# Patient Record
Sex: Female | Born: 1965 | Race: Black or African American | Hispanic: No | Smoking: Never smoker
Health system: Southern US, Community
[De-identification: ages and names within clinical notes are randomized; demographics above are authoritative.]

## PROBLEM LIST (undated history)

## (undated) DIAGNOSIS — M542 Cervicalgia: Secondary | ICD-10-CM

## (undated) HISTORY — PX: ABDOMINAL HYSTERECTOMY: SHX81

---

## 2001-10-17 ENCOUNTER — Emergency Department (HOSPITAL_COMMUNITY): Admission: EM | Admit: 2001-10-17 | Discharge: 2001-10-17 | Payer: Self-pay | Admitting: *Deleted

## 2001-10-17 ENCOUNTER — Encounter: Payer: Self-pay | Admitting: *Deleted

## 2003-03-30 ENCOUNTER — Inpatient Hospital Stay (HOSPITAL_COMMUNITY): Admission: AD | Admit: 2003-03-30 | Discharge: 2003-04-02 | Payer: Self-pay | Admitting: Obstetrics & Gynecology

## 2005-12-13 ENCOUNTER — Inpatient Hospital Stay (HOSPITAL_COMMUNITY): Admission: AD | Admit: 2005-12-13 | Discharge: 2005-12-13 | Payer: Self-pay | Admitting: Gynecology

## 2006-05-28 ENCOUNTER — Other Ambulatory Visit: Admission: RE | Admit: 2006-05-28 | Discharge: 2006-05-28 | Payer: Self-pay | Admitting: Obstetrics and Gynecology

## 2006-09-23 ENCOUNTER — Emergency Department (HOSPITAL_COMMUNITY): Admission: EM | Admit: 2006-09-23 | Discharge: 2006-09-23 | Payer: Self-pay | Admitting: Emergency Medicine

## 2006-09-25 ENCOUNTER — Ambulatory Visit: Payer: Self-pay | Admitting: Family Medicine

## 2006-10-16 ENCOUNTER — Emergency Department (HOSPITAL_COMMUNITY): Admission: EM | Admit: 2006-10-16 | Discharge: 2006-10-16 | Payer: Self-pay | Admitting: Emergency Medicine

## 2006-10-17 ENCOUNTER — Emergency Department (HOSPITAL_COMMUNITY): Admission: EM | Admit: 2006-10-17 | Discharge: 2006-10-18 | Payer: Self-pay | Admitting: Emergency Medicine

## 2007-04-19 ENCOUNTER — Inpatient Hospital Stay (HOSPITAL_COMMUNITY): Admission: AD | Admit: 2007-04-19 | Discharge: 2007-04-19 | Payer: Self-pay | Admitting: Family Medicine

## 2007-04-20 ENCOUNTER — Emergency Department (HOSPITAL_COMMUNITY): Admission: EM | Admit: 2007-04-20 | Discharge: 2007-04-20 | Payer: Self-pay | Admitting: Emergency Medicine

## 2009-12-02 ENCOUNTER — Emergency Department (HOSPITAL_COMMUNITY): Admission: EM | Admit: 2009-12-02 | Discharge: 2009-12-02 | Payer: Self-pay | Admitting: Emergency Medicine

## 2011-01-18 NOTE — Op Note (Signed)
Brianna May, Brianna May                      ACCOUNT NO.:  [id number]   MEDICAL RECORD NO.:  [id number]                   PATIENT TYPE:  INP   LOCATION:  A426                                 FACILITY:  [facility]   PHYSICIAN:  Lazaro Arms, M.D.                DATE OF BIRTH:  1966-07-23   DATE OF PROCEDURE:  03/30/2003  DATE OF DISCHARGE:                                 OPERATIVE REPORT   PREOPERATIVE DIAGNOSES:  1. Enlarged fibroid uterus.  2. Menometrorrhagia.  3. Status post transfusion x14 units of packed red blood cells total.  4. Dysmenorrhea.   POSTOPERATIVE DIAGNOSES:  1. Enlarged fibroid uterus.  2. Menometrorrhagia.  3. Status post transfusion x14 units of packed red blood cells total.  4. Dysmenorrhea.   PROCEDURE:  Abdominal hysterectomy.   SURGEON:  Lazaro Arms, M.D.   ANESTHESIA:  General endotracheal.   FINDINGS:  The patient had a mobile, enlarged uterus globally, approximately  12-week size, and it was very tender to palpation.  The adnexa were  negative.  This was confirmed at the time of surgery.  There were no other  intraperitoneal abnormalities.   DESCRIPTION OF OPERATION:  The patient was taken to the operating room and  placed in the supine position, where she underwent general endotracheal  anesthesia.  The vagina was prepped, the Foley catheter was placed.  The  abdomen was prepped and draped in the usual sterile fashion.  A Pfannenstiel  skin incision was made and carried down sharply to the rectus fascia, which  was scored in the midline and extended laterally.  The fascia was taken off  the muscle superiorly and inferiorly without difficulty.  The muscles were  divided, peritoneal cavity was entered.  The small plastic self-retaining  retractor was placed and folded down without difficulty.  The upper abdomen  was then packed away.  The uterine cornua were grasped.  The left round  ligament was suture ligated and cut.  The vesicouterine  serosal flap was  created on the left.  The utero-ovarian ligament was then crossclamped, cut,  and double suture ligated.  The uterine vessels were skeletonized on the  left.  The right round ligament was suture ligated and cut and the  vesicouterine serosal flap on the right was created.  The utero-ovarian  ligament on the right was clamped, cut, and double suture ligated with good  hemostasis.  The right uterine vessels were skeletonized.  The bladder was  pushed off the lower uterine segment without difficulty.  Both uterine  vessels were clamped, cut, and suture ligated.  Serial pedicles were taken  down the cervix through the cardinal ligament, each pedicle being clamped,  cut, and transfixed and suture ligated.  The vagina was crossclamped and the  specimen was removed.  Vaginal angle sutures were placed.  Interrupted  figure-of-eight sutures were placed for vaginal closure.  There was good  hemostasis.  The peritoneal cavity was irrigated vigorously.  All pedicles  were found to be hemostatic.  The packs were removed and the self-retaining  retractor was unfolded and removed.  All counts were correct at this point.  The muscle and peritoneum were reapproximated loosely.  The fascia was  closed using 0 Vicryl in a running fashion.  The subcutaneous tissue was  made hemostatic and irrigated.  The skin was closed using skin staples.  The  patient tolerated the procedure well.  She experienced 100 mL of blood loss,  was taken to the recovery room in good, stable condition.  All counts were  correct x3.  She received Ancef prophylactically.  All specimens went to the  lab.                                               Lazaro Arms, M.D.    Loraine Maple  D:  03/31/2003  T:  04/01/2003  Job:  [id number]

## 2011-01-18 NOTE — H&P (Signed)
[name], [name]                        ACCOUNT NO.:  [id number]   MEDICAL RECORD NO.:  [id number]                  PATIENT TYPE:   LOCATION:                                       FACILITY:   PHYSICIAN:  Lazaro Arms, M.D.                DATE OF BIRTH:  December 20, 1965   DATE OF ADMISSION:  DATE OF DISCHARGE:                                HISTORY & PHYSICAL   HISTORY OF PRESENT ILLNESS:  Brianna May is a 45 year old African-American female  gravida 3, para 3, status post tubal ligation who is admitted for ongoing  continued bleeding, anemia, and enlarged fibroid uterus.  The patient saw me  initially on February 11, 2003 after she had been seen April 21 at [facility] and transfused 12 units of packed red blood cells because of a  hemoglobin of 3.1, a large fibroid uterus, and heavy bleeding.  Her  hemoglobin got back up to 9, but as far as I could tell there was no effort  to stop her bleeding. She came in on February 11, 2003 with a hemoglobin of 7.7.  She was taking her iron 3 times a day.  I placed her on Megace 120 mg a day;  and that has, for the most part, stopped her bleeding, but not completely.  On February 25, 2003 her hemoglobin was 8.5.  I did an anemia profile which was  consistent with pure iron-deficiency anemia.  There were no other  abnormalities seen.  She has called me in the last week or so, a couple of  times, complaining of heavy bleeding episodes and heavy pain and I brought  her in today and her hemoglobin is 9.8.  With her significant bleeding down  to 3.  I am uncomfortable having her go any further despite being on 120 mg  of Megace a day; so I am admitting her to the hospital to undergo  transfusion and we are doing to do an abdominal hysterectomy tomorrow.  She  understands the indication for the procedure.  She understands the risk of  bleeding, infection, damage to other organs and we will proceed.   PAST MEDICAL HISTORY:  Significant only for irritable  bowel syndrome.   PAST SURGICAL HISTORY:  Tubal ligation.   PAST OBSTETRICAL HISTORY:  Three vaginal deliveries.   MEDICATIONS:  Her only medications are Megace 120 mg a day and iron tablets  3 times a day.   REVIEW OF SYSTEMS:  Review of systems otherwise negative.   SOCIAL HISTORY:  She does not smoke, drink, or do drugs.   PHYSICAL EXAMINATION:  VITAL SIGNS:  Her weight is 139 pounds.  Blood  pressure is 100/60.  HEENT:  Unremarkable.  NECK:  Thyroid is normal.  LUNGS:  Lungs are clear.  HEART:  Heart is regular rhythm without regurgitation or gallop.  BREASTS:  Deferred.  ABDOMEN:  Benign with tenderness in the pelvic area,  no rebound.  PELVIC:  Reveals enlarged uterus, 12-14 weeks size, multiple fibroids  palpated. Adnexa negative.  EXTREMITIES: Warm no edema.  NEUROLOGIC:  Exam is grossly intact.    IMPRESSION:  1. Enlarged fibroid uterus.  2. History of very heavy vaginal bleeding requiring 12 units of packed red     blood cells.  3. Anemia.  4. Ongoing bleeding despite aggressive therapy.   PLAN:  The patient is admitted for a preoperative transfusion and undergo  abdominal hysterectomy on March 31, 2003.  She understands the indications  for this and will proceed.                                               Lazaro Arms, M.D.    Loraine Maple  D:  03/30/2003  T:  03/30/2003  Job:  [id number]

## 2011-01-18 NOTE — Discharge Summary (Signed)
   Brianna May, Brianna May                      ACCOUNT NO.:  [id number]   MEDICAL RECORD NO.:  [id number]                   PATIENT TYPE:  INP   LOCATION:  A426                                 FACILITY:  [facility]   PHYSICIAN:  Lazaro Arms, M.D.                DATE OF BIRTH:  [birth year]   DATE OF ADMISSION:  03/30/2003  DATE OF DISCHARGE:  04/02/2003                                 DISCHARGE SUMMARY   DISCHARGE DIAGNOSES:  1. Status post abdominal hysterectomy.  2. Unremarkable postoperative course.   PROCEDURE:  As above.   Please refer to the transcribed History and Physical and operative note for  details on admission to the hospital.   HOSPITAL COURSE:  The patient was admitted on March 30, 2003, underwent  transfusion of 2 units of packed red blood cells, and underwent abdominal  hysterectomy on July 29.  She had an unremarkable postoperative course.  Her  intraoperative course was unremarkable as well; please refer to the  operative note for details.  She tolerated clear liquids and regular diet,  voided without symptoms, was ambulatory, remained afebrile.  Her incision is  clean, dry, and intact.  Her abdominal exam is benign.  She is discharged to  home on the morning of postoperative day #2 in good, stable condition to  follow up in the office on Wednesday, have her staples removed.  She was  given instructions and precautions for return prior to that time.  She was  given Motrin and Tylox for pain.                                               Lazaro Arms, M.D.    Loraine Maple  D:  04/02/2003  T:  04/02/2003  Job:  [id number]

## 2011-06-14 LAB — URINALYSIS, ROUTINE W REFLEX MICROSCOPIC
Glucose, UA: NEGATIVE
Ketones, ur: NEGATIVE
Protein, ur: NEGATIVE
Specific Gravity, Urine: 1.02
Urobilinogen, UA: 1
pH: 7

## 2018-02-01 ENCOUNTER — Emergency Department (HOSPITAL_COMMUNITY)
Admission: EM | Admit: 2018-02-01 | Discharge: 2018-02-01 | Disposition: A | Discharge status: Home / Self Care | Payer: Self-pay | Attending: Emergency Medicine | Admitting: Emergency Medicine

## 2018-02-01 ENCOUNTER — Encounter (HOSPITAL_COMMUNITY): Payer: Self-pay | Admitting: Emergency Medicine

## 2018-02-01 ENCOUNTER — Emergency Department (HOSPITAL_COMMUNITY): Payer: Self-pay

## 2018-02-01 DIAGNOSIS — M5416 Radiculopathy, lumbar region: Secondary | ICD-10-CM | POA: Insufficient documentation

## 2018-02-01 DIAGNOSIS — R1011 Right upper quadrant pain: Secondary | ICD-10-CM | POA: Insufficient documentation

## 2018-02-01 DIAGNOSIS — Z79899 Other long term (current) drug therapy: Secondary | ICD-10-CM | POA: Insufficient documentation

## 2018-02-01 HISTORY — DX: Cervicalgia: M54.2

## 2018-02-01 LAB — URINALYSIS, ROUTINE W REFLEX MICROSCOPIC
Bilirubin Urine: NEGATIVE
Glucose, UA: NEGATIVE mg/dL
Ketones, ur: NEGATIVE mg/dL
Nitrite: NEGATIVE
Protein, ur: NEGATIVE mg/dL
Specific Gravity, Urine: 1.02 (ref 1.005–1.030)
pH: 6 (ref 5.0–8.0)

## 2018-02-01 LAB — BASIC METABOLIC PANEL
Anion gap: 9 (ref 5–15)
BUN: 11 mg/dL (ref 6–20)
CO2: 27 mmol/L (ref 22–32)
Calcium: 9.1 mg/dL (ref 8.9–10.3)
Chloride: 102 mmol/L (ref 101–111)
Creatinine, Ser: 0.79 mg/dL (ref 0.44–1.00)
GFR calc Af Amer: >60 mL/min (ref >60)
GFR calc non Af Amer: >60 mL/min (ref >60)
Glucose, Bld: 77 mg/dL (ref 65–99)
Potassium: 3.7 mmol/L (ref 3.5–5.1)
Sodium: 138 mmol/L (ref 135–145)

## 2018-02-01 LAB — CBC WITH DIFFERENTIAL/PLATELET
Basophils Absolute: 0 10*3/uL (ref 0.0–0.1)
Basophils Relative: 0 %
Eosinophils Absolute: 0.3 10*3/uL (ref 0.0–0.7)
Eosinophils Relative: 3 %
HCT: 40.7 % (ref 36.0–46.0)
Hemoglobin: 13.9 g/dL (ref 12.0–15.0)
Lymphocytes Relative: 25 %
Lymphs Abs: 2.5 10*3/uL (ref 0.7–4.0)
MCH: 29.1 pg (ref 26.0–34.0)
MCHC: 34.2 g/dL (ref 30.0–36.0)
MCV: 85.3 fL (ref 78.0–100.0)
Monocytes Absolute: 0.6 10*3/uL (ref 0.1–1.0)
Monocytes Relative: 6 %
Neutro Abs: 6.8 10*3/uL (ref 1.7–7.7)
Neutrophils Relative %: 66 %
Platelets: 118 10*3/uL — ABNORMAL LOW (ref 150–400)
RBC: 4.77 MIL/uL (ref 3.87–5.11)
RDW: 13.1 % (ref 11.5–15.5)
WBC: 10.3 10*3/uL (ref 4.0–10.5)

## 2018-02-01 MED ORDER — SODIUM CHLORIDE 0.9 % IV BOLUS
1000.0000 mL | Freq: Once | INTRAVENOUS | Status: AC
  Administered 2018-02-01: 1000 mL via INTRAVENOUS

## 2018-02-01 MED ORDER — DEXAMETHASONE 4 MG PO TABS: 4.0000 mg | ORAL_TABLET | Freq: Two times a day (BID) | ORAL | 0 refills | Status: AC

## 2018-02-01 MED ORDER — TRAMADOL HCL 50 MG PO TABS: 50.0000 mg | ORAL_TABLET | Freq: Four times a day (QID) | ORAL | 0 refills | Status: AC | PRN

## 2018-02-01 MED ORDER — DEXAMETHASONE SODIUM PHOSPHATE 4 MG/ML IJ SOLN
8.0000 mg | Freq: Once | INTRAMUSCULAR | Status: AC
  Administered 2018-02-01: 8 mg via INTRAVENOUS
  Filled 2018-02-01: qty 2

## 2018-02-01 MED ORDER — KETOROLAC TROMETHAMINE 30 MG/ML IJ SOLN
15.0000 mg | Freq: Once | INTRAMUSCULAR | Status: AC
  Administered 2018-02-01: 15 mg via INTRAVENOUS
  Filled 2018-02-01: qty 1

## 2018-02-01 MED ORDER — HYDROMORPHONE HCL 1 MG/ML IJ SOLN
1.0000 mg | Freq: Once | INTRAMUSCULAR | Status: AC
  Administered 2018-02-01: 1 mg via INTRAVENOUS
  Filled 2018-02-01: qty 1

## 2018-02-01 MED ORDER — ONDANSETRON 4 MG PO TBDP
4.0000 mg | ORAL_TABLET | Freq: Once | ORAL | Status: AC
  Administered 2018-02-01: 4 mg via ORAL
  Filled 2018-02-01: qty 1

## 2018-02-01 NOTE — ED Triage Notes (Signed)
Pt reports flank pain x 2 weeks.  [facility] and dx with kidney infection and was told she had a "mass of kidney stones" on the right.  States bilateral flank pain, but worse on right.  Was at work today and pain became unbearable.  Started abx Wednesday.

## 2018-02-01 NOTE — ED Provider Notes (Signed)
[facility]Y [facility]PART[facility] Provider Note   CSN: [id number] Arrival date & time: 02/01/18  1422     History   Chief Complaint Chief Complaint  Patient presents with  . Flank Pain    HPI Brianna May is a 52 y.o. female.  HPI   52 year old female with right lower back pain.  Atraumatic.  Gradual onset about 2 weeks ago.  Initially tolerable.  The past several days has progressively worsened.  Pain is constant.  Worse with movement again to the point where she is having a hard time ambulating because of the pain. Radiates into R buttock and down posterior thigh to about the knee.  She [facility] that [facility]  She states they diagnosed her with a kidney infection and placed on antibiotics.  She has not felt better despite this.  She has no urinary complaints.  No unusual vaginal bleeding or discharge.  She has been taking naproxen with only minimal improvement.  Past Medical History:  Diagnosis Date  . Neck pain     There are no active problems to display for this patient.   Past Surgical History:  Procedure Laterality Date  . ABDOMINAL HYSTERECTOMY       OB History   None      Home Medications    Prior to Admission medications   Medication Sig Start Date End Date Taking? Authorizing Provider  naproxen (NAPROSYN) 250 MG tablet Take 250 mg by mouth 2 (two) times daily with a meal.   Yes [provider]    Family History History reviewed. No pertinent family history.  Social History Social History   Tobacco Use  . Smoking status: Never Smoker  . Smokeless tobacco: Never Used  Substance Use Topics  . Alcohol use: Not Currently    Frequency: Never  . Drug use: Yes    Types: Marijuana    Comment: occasional     Allergies   Patient has no known allergies.   Review of Systems Review of Systems  All systems reviewed and negative, other than as noted in HPI.  Physical Exam Updated Vital  Signs BP 130/84 (BP Location: Right Arm)   Pulse 74   Temp 97.6 F (36.4 C) (Oral)   Resp (!) 22   Ht 5\' 6"  (1.676 m)   Wt 64.9 kg (143 lb)   SpO2 98%   BMI 23.08 kg/m   Physical Exam  Constitutional: She appears well-developed and well-nourished. No distress.  HENT:  Head: Normocephalic and atraumatic.  Eyes: Conjunctivae are normal. Right eye exhibits no discharge. Left eye exhibits no discharge.  Neck: Neck supple.  Cardiovascular: Normal rate, regular rhythm and normal heart sounds. Exam reveals no gallop and no friction rub.  No murmur heard. Pulmonary/Chest: Effort normal and breath sounds normal. No respiratory distress.  Abdominal: Soft. She exhibits no distension. There is no tenderness.  Musculoskeletal: She exhibits no edema or tenderness.  Mild tenderness to palpation in the right lumbar region and down into the right buttock.  No overlying skin changes.  No midline spinal tenderness..  Neurovascularly intact distally.  Neurological: She is alert.  Skin: Skin is warm and dry.  Psychiatric: She has a normal mood and affect. Her behavior is normal. Thought content normal.  Nursing note and vitals reviewed.    ED Treatments / Results  Labs (all labs ordered are listed, but only abnormal results are displayed) Labs Reviewed  URINALYSIS, ROUTINE W REFLEX MICROSCOPIC -  Abnormal; Notable for the following components:      Result Value   APPearance HAZY (*)    Hgb urine dipstick SMALL (*)    Leukocytes, UA SMALL (*)    Bacteria, UA RARE (*)    All other components within normal limits  CBC WITH DIFFERENTIAL/PLATELET - Abnormal; Notable for the following components:   Platelets 118 (*)    All other components within normal limits  URINE CULTURE  BASIC METABOLIC PANEL    EKG None  Radiology Ct Renal Stone Study  Result Date: 02/01/2018 CLINICAL DATA:  52 y/o  F; 2 weeks of flank pain worse on the right. EXAM: CT ABDOMEN AND PELVIS WITHOUT CONTRAST TECHNIQUE:  Multidetector CT imaging of the abdomen and pelvis was performed following the standard protocol without IV contrast. COMPARISON:  12/13/2005 CT abdomen and pelvis. FINDINGS: Lower chest: No acute abnormality. Hepatobiliary: No focal liver abnormality is seen. No gallstones, gallbladder wall thickening, or biliary dilatation. Pancreas: Unremarkable. No pancreatic ductal dilatation or surrounding inflammatory changes. Spleen: Normal in size without focal abnormality. Adrenals/Urinary Tract: Adrenal glands are unremarkable. Punctate right kidney interpolar nonobstructing stone (series 5, image 41). No hydronephrosis or ureter stone identified. Normal bladder. Stomach/Bowel: Stomach is within normal limits. Appendix appears normal. No evidence of bowel wall thickening, distention, or inflammatory changes. Vascular/Lymphatic: No significant vascular findings are present. No enlarged abdominal or pelvic lymph nodes. Reproductive: Status post hysterectomy. No adnexal masses. Other: No abdominal wall hernia or abnormality. No abdominopelvic ascites. Musculoskeletal: No acute or significant osseous findings. IMPRESSION: 1. Punctate nonobstructing right kidney interpolar stone. No hydronephrosis or ureter stone. 2. Otherwise unremarkable CT of the abdomen and pelvis. Electronically Signed   By: Mitzi Hansen M.D.   On: 02/01/2018 16:06    Procedures Procedures (including critical care time)  Medications Ordered in ED Medications  HYDROmorphone (DILAUDID) injection 1 mg (1 mg Intravenous Given 02/01/18 1535)  ketorolac (TORADOL) 30 MG/ML injection 15 mg (15 mg Intravenous Given 02/01/18 1535)  sodium chloride 0.9 % bolus 1,000 mL (0 mLs Intravenous Stopped 02/01/18 1636)  dexamethasone (DECADRON) injection 8 mg (8 mg Intravenous Given 02/01/18 1535)     Initial Impression / Assessment and Plan / ED Course  I have reviewed the triage vital signs and the nursing notes.  Pertinent labs & imaging results that  were available during my care of the patient were reviewed by me and considered in my medical decision making (see chart for details).     52 year old female with symptoms consistent with a lumbar radiculopathy.  Her neuro exam is nonfocal.  CT abdomen/pelvis this is unremarkable.  Symptoms now much improved.  Plan continue systematic treatment.  Return precautions discussed.  Outpatient follow-up otherwise.  Final Clinical Impressions(s) / ED Diagnoses   Final diagnoses:  Lumbar radiculopathy, acute    ED Discharge Orders    None       Raeford Razor, MD 02/01/18 1704

## 2018-02-03 LAB — URINE CULTURE

## 2019-11-21 IMAGING — CT CT RENAL STONE PROTOCOL
2 of 4 series · 16 of 46 positions shown, 18 images · non-contrast
Comparison: 12/13/2005 CT abdomen and pelvis.

CLINICAL DATA: 51 y/o  F; 2 weeks of flank pain worse on the right.

EXAM:
CT ABDOMEN AND PELVIS WITHOUT CONTRAST
TECHNIQUE: Multidetector CT imaging of the abdomen and pelvis was performed
following the standard protocol without IV contrast.

[Series 2: axial st · axial · 0.68mm/px · z∈[+787,+1182]mm · 13 of 87 slices shown, 15 images]
[im 4/87  soft-tissue]
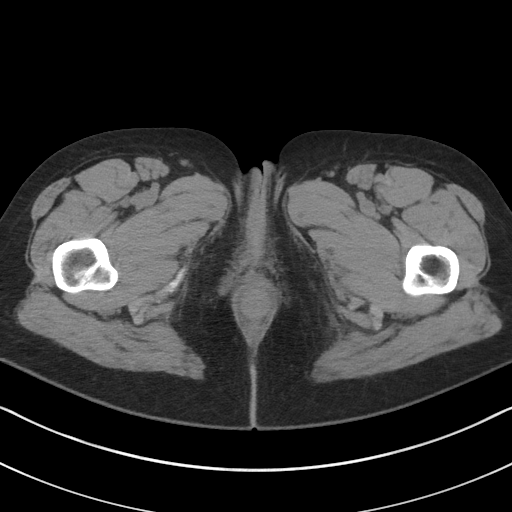
[im 4/87  bone]
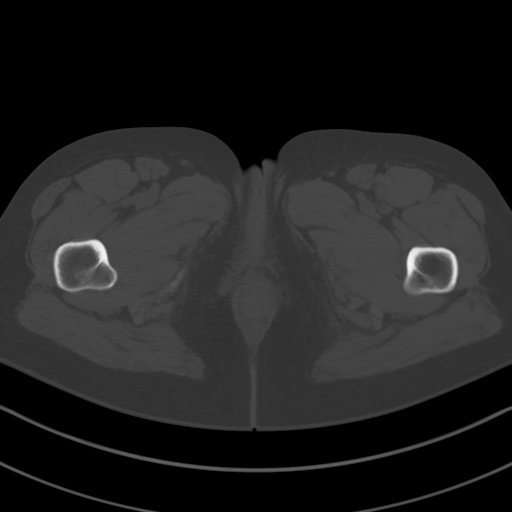
[im 10/87  soft-tissue]
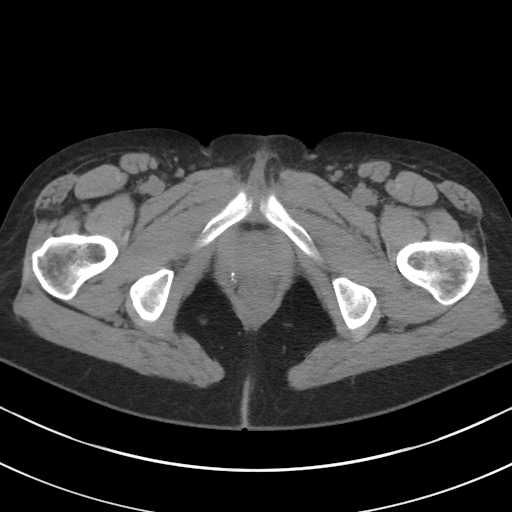
[im 17/87  soft-tissue]
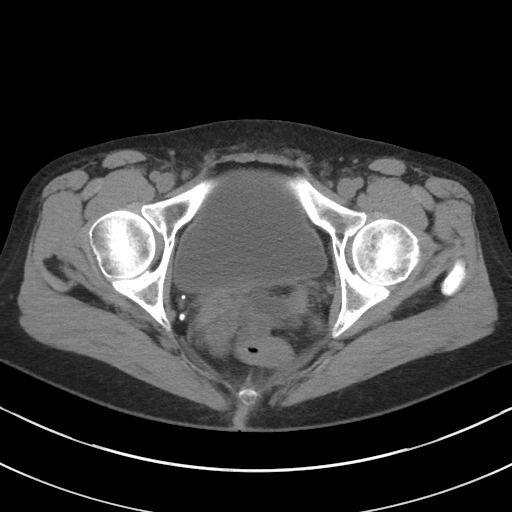
[im 24/87  soft-tissue]
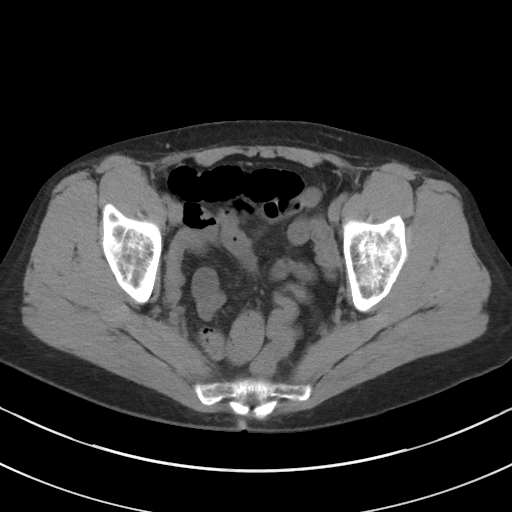
[im 30/87  soft-tissue]
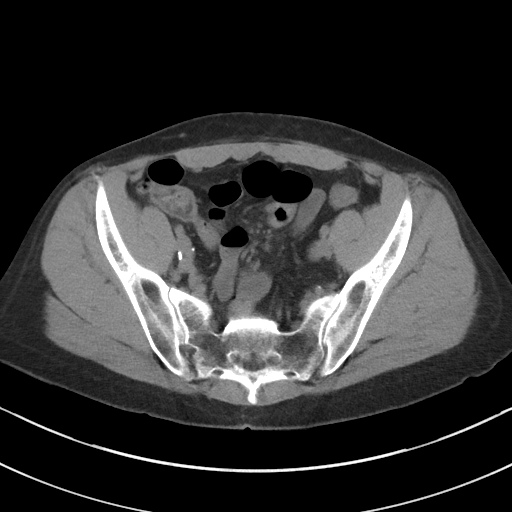
[im 37/87  soft-tissue]
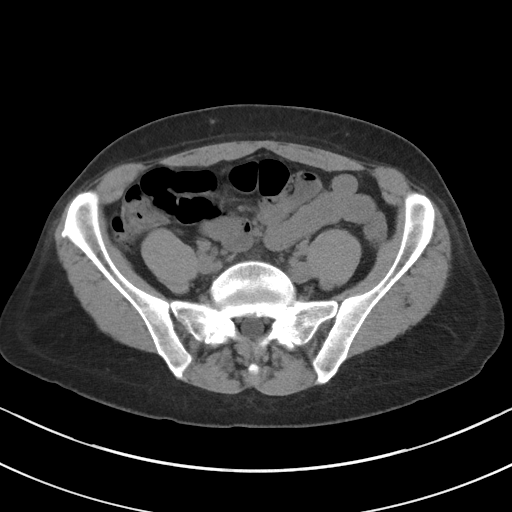
[im 44/87  soft-tissue]
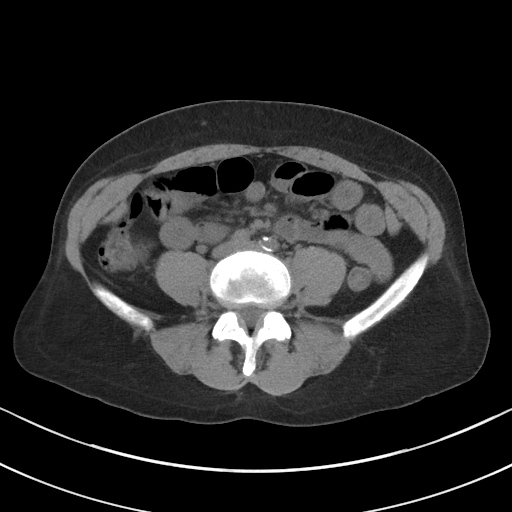
[im 50/87  soft-tissue]
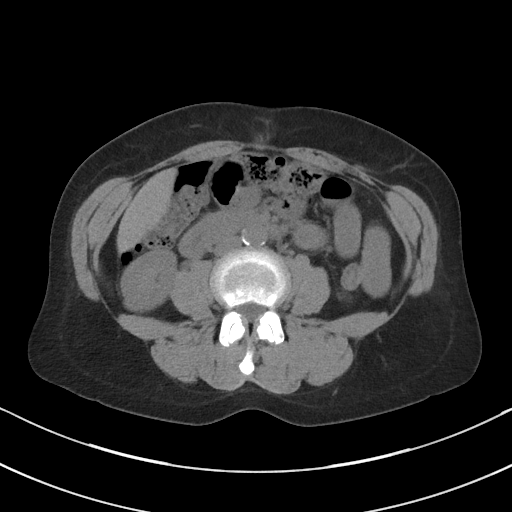
[im 57/87  soft-tissue]
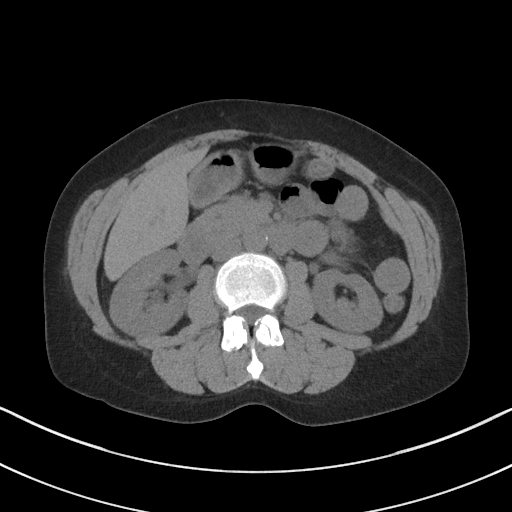
[im 57/87  bone]
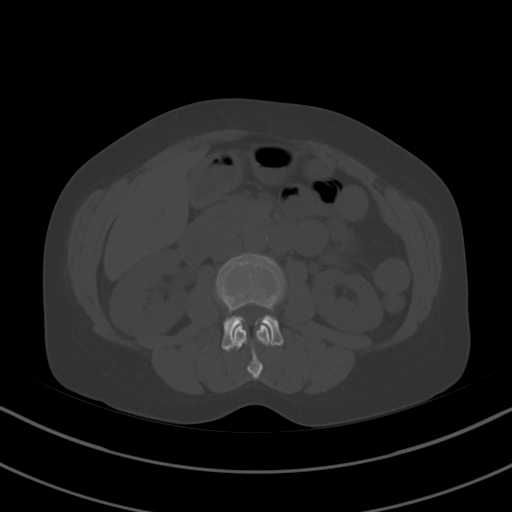
[im 63/87  soft-tissue]
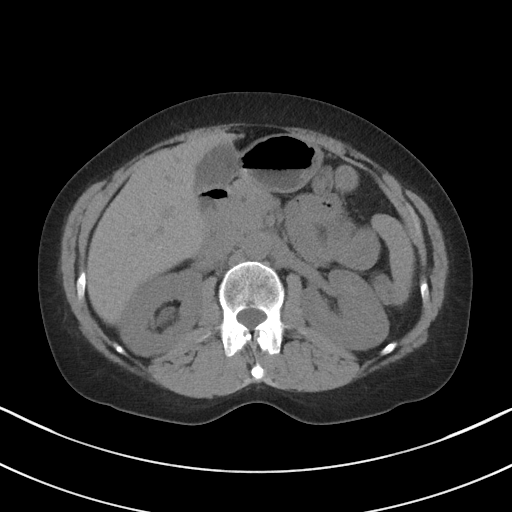
[im 70/87  soft-tissue]
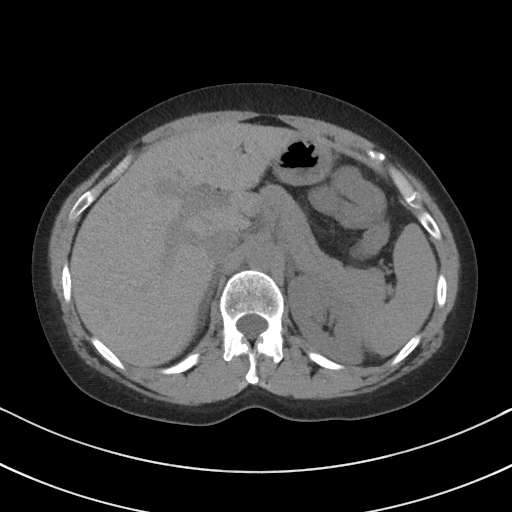
[im 77/87  soft-tissue]
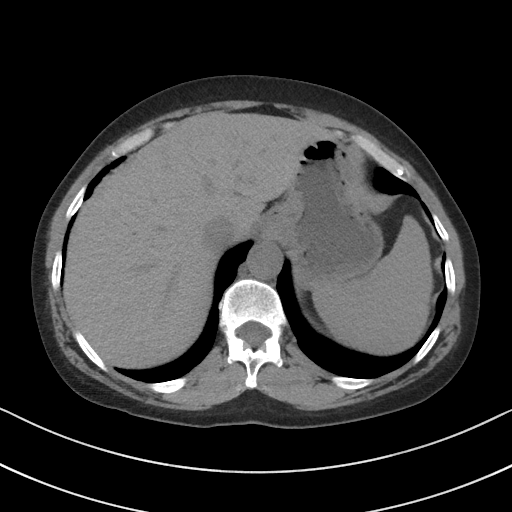
[im 83/87  soft-tissue]
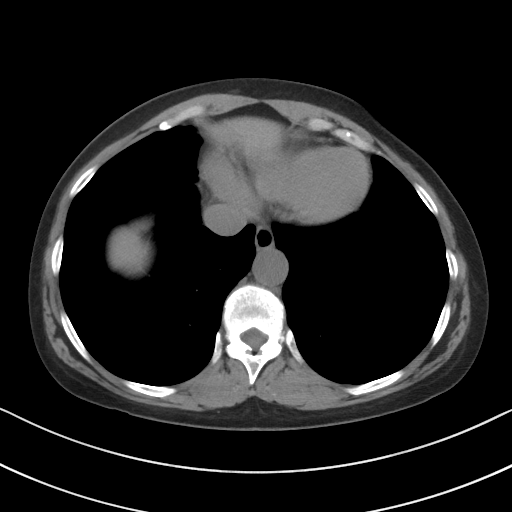

[Series 5: coronal st · coronal · 0.73mm/px · 3 of 72 slices shown]
[im 24/72  soft-tissue]
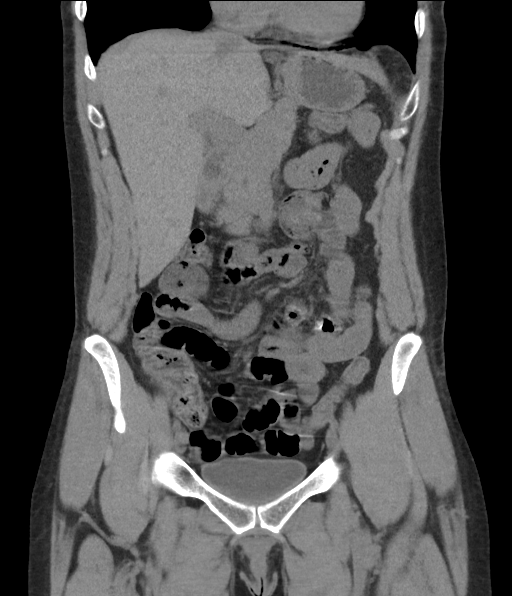
[im 32/72  soft-tissue]
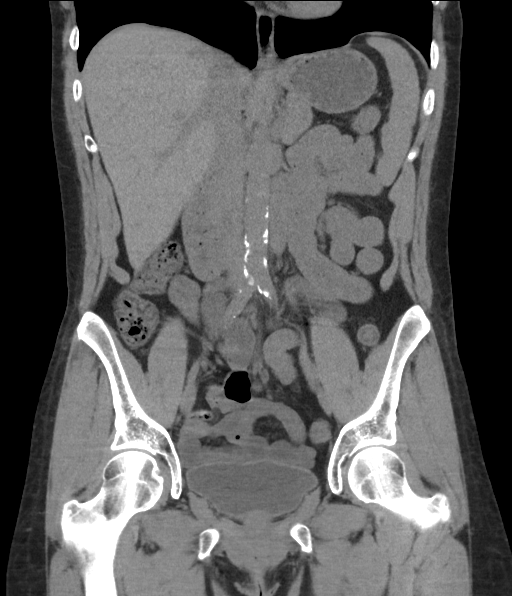
[im 40/72  soft-tissue]
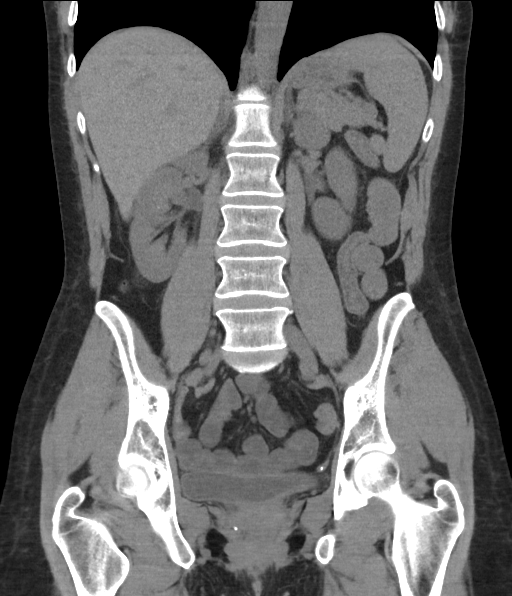

[16 of 46 positions shown; findings below may reference images not displayed]

FINDINGS: Lower chest: No acute abnormality.

Hepatobiliary: No focal liver abnormality is seen. No gallstones,
gallbladder wall thickening, or biliary dilatation.

Pancreas: Unremarkable. No pancreatic ductal dilatation or
surrounding inflammatory changes.

Spleen: Normal in size without focal abnormality.

Adrenals/Urinary Tract: Adrenal glands are unremarkable. Punctate
right kidney interpolar nonobstructing stone (series 5, image 41).
No hydronephrosis or ureter stone identified. Normal bladder.

Stomach/Bowel: Stomach is within normal limits. Appendix appears
normal. No evidence of bowel wall thickening, distention, or
inflammatory changes.

Vascular/Lymphatic: No significant vascular findings are present. No
enlarged abdominal or pelvic lymph nodes.

Reproductive: Status post hysterectomy. No adnexal masses.

Other: No abdominal wall hernia or abnormality. No abdominopelvic
ascites.

Musculoskeletal: No acute or significant osseous findings.
IMPRESSION: 1. Punctate nonobstructing right kidney interpolar stone. No
hydronephrosis or ureter stone.
2. Otherwise unremarkable CT of the abdomen and pelvis.

By: Calistro Moba M.D.

## 2022-04-16 ENCOUNTER — Encounter (HOSPITAL_COMMUNITY): Payer: Self-pay

## 2022-04-16 ENCOUNTER — Other Ambulatory Visit: Payer: Self-pay

## 2022-04-16 ENCOUNTER — Emergency Department (HOSPITAL_COMMUNITY)
Admission: EM | Admit: 2022-04-16 | Discharge: 2022-04-17 | Discharge status: Against Medical Advice | Payer: BLUE CROSS/BLUE SHIELD | Attending: Emergency Medicine | Admitting: Emergency Medicine

## 2022-04-16 DIAGNOSIS — R519 Headache, unspecified: Secondary | ICD-10-CM | POA: Diagnosis present

## 2022-04-16 DIAGNOSIS — R109 Unspecified abdominal pain: Secondary | ICD-10-CM | POA: Insufficient documentation

## 2022-04-16 DIAGNOSIS — Z5321 Procedure and treatment not carried out due to patient leaving prior to being seen by health care provider: Secondary | ICD-10-CM | POA: Diagnosis not present

## 2022-04-16 NOTE — ED Triage Notes (Signed)
Headache and abdominal pain for 3 days. Says she can get rid of either one of them.  Last bm yesterday was "slimy"
